# Patient Record
Sex: Male | Born: 1975 | Race: White | Hispanic: No | Marital: Married
Health system: Southern US, Community
[De-identification: ages and names within clinical notes are randomized; demographics above are authoritative.]

---

## 2013-12-08 ENCOUNTER — Other Ambulatory Visit (HOSPITAL_COMMUNITY): Payer: Self-pay | Admitting: Urology

## 2013-12-08 DIAGNOSIS — S3730XA Unspecified injury of urethra, initial encounter: Principal | ICD-10-CM

## 2013-12-08 DIAGNOSIS — S3720XA Unspecified injury of bladder, initial encounter: Secondary | ICD-10-CM

## 2013-12-16 ENCOUNTER — Ambulatory Visit (HOSPITAL_COMMUNITY)
Admission: RE | Admit: 2013-12-16 | Discharge: 2013-12-16 | Disposition: A | Discharge status: Home / Self Care | Payer: BC Managed Care – PPO | Source: Ambulatory Visit | Attending: Urology | Admitting: Urology

## 2013-12-16 DIAGNOSIS — X58XXXA Exposure to other specified factors, initial encounter: Secondary | ICD-10-CM | POA: Insufficient documentation

## 2013-12-16 DIAGNOSIS — S3730XA Unspecified injury of urethra, initial encounter: Secondary | ICD-10-CM | POA: Diagnosis present

## 2013-12-16 DIAGNOSIS — S3720XA Unspecified injury of bladder, initial encounter: Secondary | ICD-10-CM | POA: Insufficient documentation

## 2013-12-16 DIAGNOSIS — Y9379 Activity, other specified sports and athletics: Secondary | ICD-10-CM | POA: Insufficient documentation

## 2013-12-16 MED ORDER — IOHEXOL 300 MG/ML  SOLN
50.0000 mL | Freq: Once | INTRAMUSCULAR | Status: AC | PRN
  Administered 2013-12-16: 20 mL via URETHRAL

## 2013-12-17 ENCOUNTER — Other Ambulatory Visit (HOSPITAL_COMMUNITY): Payer: Self-pay | Admitting: Urology

## 2013-12-17 DIAGNOSIS — S3720XS Unspecified injury of bladder, sequela: Secondary | ICD-10-CM

## 2013-12-31 ENCOUNTER — Ambulatory Visit (HOSPITAL_COMMUNITY)
Admission: RE | Admit: 2013-12-31 | Discharge: 2013-12-31 | Disposition: A | Discharge status: Home / Self Care | Payer: BC Managed Care – PPO | Source: Ambulatory Visit | Attending: Urology | Admitting: Urology

## 2013-12-31 DIAGNOSIS — N323 Diverticulum of bladder: Secondary | ICD-10-CM | POA: Insufficient documentation

## 2013-12-31 DIAGNOSIS — IMO0002 Reserved for concepts with insufficient information to code with codable children: Secondary | ICD-10-CM | POA: Diagnosis present

## 2013-12-31 DIAGNOSIS — X58XXXA Exposure to other specified factors, initial encounter: Secondary | ICD-10-CM | POA: Insufficient documentation

## 2013-12-31 DIAGNOSIS — S3720XS Unspecified injury of bladder, sequela: Secondary | ICD-10-CM

## 2013-12-31 MED ORDER — DIATRIZOATE MEGLUMINE 30 % UR SOLN
Freq: Once | URETHRAL | Status: AC | PRN
  Administered 2013-12-31: 300 mL

## 2013-12-31 MED ORDER — LIDOCAINE VISCOUS 2 % MT SOLN
OROMUCOSAL | Status: AC
  Filled 2013-12-31: qty 15
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
